# Patient Record
Sex: Male | Born: 1975 | Race: Black or African American | Hispanic: No | Marital: Single | Smoking: Current every day smoker
Health system: Southern US, Community
[De-identification: ages and names within clinical notes are randomized; demographics above are authoritative.]

## PROBLEM LIST (undated history)

## (undated) DIAGNOSIS — I219 Acute myocardial infarction, unspecified: Secondary | ICD-10-CM

## (undated) DIAGNOSIS — I2510 Atherosclerotic heart disease of native coronary artery without angina pectoris: Secondary | ICD-10-CM

## (undated) DIAGNOSIS — Z9289 Personal history of other medical treatment: Secondary | ICD-10-CM

## (undated) HISTORY — PX: OTHER SURGICAL HISTORY: SHX169

---

## 2003-06-21 DIAGNOSIS — I219 Acute myocardial infarction, unspecified: Secondary | ICD-10-CM

## 2003-06-21 HISTORY — DX: Acute myocardial infarction, unspecified: I21.9

## 2011-12-11 ENCOUNTER — Encounter (HOSPITAL_COMMUNITY): Payer: Self-pay | Admitting: Nurse Practitioner

## 2011-12-11 ENCOUNTER — Emergency Department (HOSPITAL_COMMUNITY)
Admission: EM | Admit: 2011-12-11 | Discharge: 2011-12-11 | Disposition: A | Discharge status: Home / Self Care | Payer: Self-pay | Attending: Emergency Medicine | Admitting: Emergency Medicine

## 2011-12-11 DIAGNOSIS — R51 Headache: Secondary | ICD-10-CM | POA: Insufficient documentation

## 2011-12-11 DIAGNOSIS — R079 Chest pain, unspecified: Secondary | ICD-10-CM | POA: Insufficient documentation

## 2011-12-11 HISTORY — DX: Acute myocardial infarction, unspecified: I21.9

## 2011-12-11 MED ORDER — ACETAMINOPHEN 325 MG PO TABS
650.0000 mg | ORAL_TABLET | Freq: Once | ORAL | Status: AC
  Administered 2011-12-11: 650 mg via ORAL
  Filled 2011-12-11: qty 2

## 2011-12-11 NOTE — ED Provider Notes (Signed)
History  This chart was scribed for Bernard Numbers, MD by Bennett Scrape. This patient was seen in room TR05C/TR05C and the patient's care was started at 2:46PM.  CSN: [id number]  Arrival date & time 12/11/11  1419   First MD Initiated Contact with Patient 12/11/11 1446      Chief Complaint  Patient presents with  . Headache    The history is provided by the patient. No language interpreter was used.   Bernard Pierce is a 36 y.o. male who presents to the Emergency Department complaining of a  gradual onset, gradually improving, constant HA since yesterday that he woke up with and one episode of intermittent sharp sternal chest pains and blurred vision. He denies chest pain and blurred vision currently. He has a h/o MI associated with cocaine use 8 years ago and denies that this pain is similar. He rates his HA a 2 out of 10 currently. He denies taking OTC medications at home to improve symptoms. He denies having any modifying factors. He also reports one month of fatigue that started after he stopped jogging but reports increased stress at work and reports working 30 hours straight occasionally. He also states drinking large amounts of coffee and Red Bulls but denies having any today. He also states that he had his systolic BP measured at 127 at church today and was concerned that it was high. He denies fevers, hematochezia, neck pain, numbness, nausea and emesis as associated symptoms. Pt is a rare alcohol user but denies smoking.  Past Medical History  Diagnosis Date  . Myocardial infarction-pt was using cocaine at the time 2004-2005    History reviewed. No pertinent past surgical history.  History reviewed. No pertinent family history.  History  Substance Use Topics  . Smoking status: Never Smoker   . Smokeless tobacco: Not on file  . Alcohol Use: Yes     rare      Review of Systems  Constitutional: Positive for fatigue. Negative for fever.  HENT: Negative for neck pain.     Eyes: Positive for visual disturbance (resolved). Negative for photophobia.  Cardiovascular: Positive for chest pain (resolved). Negative for palpitations.  Gastrointestinal: Negative for vomiting and diarrhea.  Neurological: Positive for headaches. Negative for numbness.  All other systems reviewed and are negative.    Allergies  Review of patient's allergies indicates no known allergies.  Home Medications   Current Outpatient Rx  Name Route Sig Dispense Refill  . ADULT MULTIVITAMIN W/MINERALS CH Oral Take 1 tablet by mouth daily.      Triage Vitals: BP 110/53  Pulse 55  Temp 97.9 F (36.6 C)  Resp 12  Ht 6\' 2"  (1.88 m)  Wt 220 lb (99.791 kg)  BMI 28.25 kg/m2  SpO2 97%  Physical Exam  Nursing note and vitals reviewed.  GEN: Well-developed, well-nourished male in  no distress HEENT: Atraumatic, normocephalic. Oropharynx clear without erythema EYES: PERRLA BL, no scleral icterus, no photophobia, EOM intact NECK: Trachea midline, no meningismus CV: regular rate  PULM: No respiratory distress.   Neuro: cranial nerves grossly 2-12 intact, no abnormalities of strength or sensation, A and O x 3, no pronator drift, normal finger to nose,  MSK: Patient moves all 4 extremities symmetrically, no deformity, edema, or injury noted Skin: No rashes petechiae, purpura, or jaundice Psych: no abnormality of mood  ED Course  Procedures (including critical care time)  DIAGNOSTIC STUDIES: Oxygen Saturation is 97% on room air, adequate by my interpretation.  COORDINATION OF CARE: 3:46PM-Discussed discharge plan of tylenol with pt and pt agreed to plan. Advised pt to make time for exercising and to take Excedrin as needed to help reduce the stress and HAs.     Labs Reviewed - No data to display No results found.   1. Headache       MDM  Patient was evaluated by myself. He had an absolutely normal neurologic exam and reported a headache was only down to 2/10 by the time  evaluated him. He denied any concerning exposures or symptoms including nausea, vomiting, and headache, or neck pain. Patient's blood pressure he was not having any chest pain on my valuation. Patient's prior chest pain had been associated with cocaine use which has not been active for some time. Patient was given 2 Tylenol for his headache. He has been working very hard at work and not sleeping as much as usual which is likely affecting this as well. He was discharged in good condition with instructions to continue over-the-counter medications. No intracranial imaging was warranted today based on my exam and patient presentation. Patient was comfortable with plan and was discharged in good condition.      I personally performed the services described in this documentation, which was scribed in my presence. The recorded information has been reviewed and considered.      Bernard Numbers, MD 12/11/11 [phone]

## 2011-12-11 NOTE — Discharge Instructions (Signed)

## 2011-12-11 NOTE — ED Notes (Signed)
Pt reports a headache since waking yesterday am, and fatigue over past few days. States he has been experiencing lots of stress at work. He is worried because his pastor checked his vital signs at church today and told him his "blood pressure was high and pulse was a little low."

## 2012-01-19 DIAGNOSIS — Z9289 Personal history of other medical treatment: Secondary | ICD-10-CM

## 2012-01-19 HISTORY — DX: Personal history of other medical treatment: Z92.89

## 2012-01-23 ENCOUNTER — Encounter (HOSPITAL_COMMUNITY): Payer: Self-pay | Admitting: *Deleted

## 2012-01-23 ENCOUNTER — Other Ambulatory Visit: Payer: Self-pay

## 2012-01-23 ENCOUNTER — Emergency Department (HOSPITAL_COMMUNITY): Payer: Medicaid Other

## 2012-01-23 ENCOUNTER — Emergency Department (HOSPITAL_COMMUNITY)
Admission: EM | Admit: 2012-01-23 | Discharge: 2012-01-23 | Disposition: A | Discharge status: Home / Self Care | Payer: Medicaid Other | Attending: Emergency Medicine | Admitting: Emergency Medicine

## 2012-01-23 DIAGNOSIS — I2510 Atherosclerotic heart disease of native coronary artery without angina pectoris: Secondary | ICD-10-CM | POA: Insufficient documentation

## 2012-01-23 DIAGNOSIS — R079 Chest pain, unspecified: Secondary | ICD-10-CM

## 2012-01-23 DIAGNOSIS — I252 Old myocardial infarction: Secondary | ICD-10-CM | POA: Insufficient documentation

## 2012-01-23 HISTORY — DX: Atherosclerotic heart disease of native coronary artery without angina pectoris: I25.10

## 2012-01-23 LAB — COMPREHENSIVE METABOLIC PANEL
ALT: 26 U/L (ref 0–53)
AST: 27 U/L (ref 0–37)
Calcium: 9.5 mg/dL (ref 8.4–10.5)
GFR calc Af Amer: >90 mL/min (ref >90)
Sodium: 139 mEq/L (ref 135–145)
Total Protein: 7.3 g/dL (ref 6.0–8.3)

## 2012-01-23 LAB — URINALYSIS, ROUTINE W REFLEX MICROSCOPIC
Bilirubin Urine: NEGATIVE
Glucose, UA: NEGATIVE mg/dL
Specific Gravity, Urine: 1.015 (ref 1.005–1.030)
Urobilinogen, UA: 0.2 mg/dL (ref 0.0–1.0)

## 2012-01-23 LAB — CBC WITH DIFFERENTIAL/PLATELET
Basophils Absolute: 0 10*3/uL (ref 0.0–0.1)
Basophils Relative: 0 % (ref 0–1)
Eosinophils Absolute: 0.1 10*3/uL (ref 0.0–0.7)
Eosinophils Relative: 1 % (ref 0–5)
MCH: 27.5 pg (ref 26.0–34.0)
MCHC: 34 g/dL (ref 30.0–36.0)
MCV: 80.8 fL (ref 78.0–100.0)
Platelets: 352 10*3/uL (ref 150–400)
RDW: 13.5 % (ref 11.5–15.5)
WBC: 5.1 10*3/uL (ref 4.0–10.5)

## 2012-01-23 LAB — URINE MICROSCOPIC-ADD ON

## 2012-01-23 LAB — RAPID URINE DRUG SCREEN, HOSP PERFORMED
Amphetamines: NOT DETECTED
Cocaine: NOT DETECTED
Opiates: NOT DETECTED
Tetrahydrocannabinol: NOT DETECTED

## 2012-01-23 LAB — TROPONIN I: Troponin I: <0.3 ng/mL (ref <0.30)

## 2012-01-23 MED ORDER — KETOROLAC TROMETHAMINE 30 MG/ML IJ SOLN
30.0000 mg | Freq: Once | INTRAMUSCULAR | Status: AC
  Administered 2012-01-23: 30 mg via INTRAVENOUS
  Filled 2012-01-23: qty 1

## 2012-01-23 MED ORDER — GI COCKTAIL ~~LOC~~
30.0000 mL | Freq: Once | ORAL | Status: AC
  Administered 2012-01-23: 30 mL via ORAL
  Filled 2012-01-23: qty 30
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
## 2012-01-23 NOTE — ED Notes (Signed)
Chest pain with sob, hx of chest pain in past assoc with cocaine, but denies any use now.  Says he began sweating.onset  4 days ago. And worse today.

## 2012-01-26 ENCOUNTER — Encounter: Payer: Self-pay | Admitting: Cardiovascular Disease

## 2012-01-30 ENCOUNTER — Ambulatory Visit (INDEPENDENT_AMBULATORY_CARE_PROVIDER_SITE_OTHER): Payer: Self-pay | Admitting: Cardiovascular Disease

## 2012-01-30 ENCOUNTER — Encounter: Payer: Self-pay | Admitting: Cardiology

## 2012-01-30 ENCOUNTER — Encounter: Payer: Self-pay | Admitting: Cardiovascular Disease

## 2012-01-30 VITALS — BP 102/62 | HR 56 | Ht 74.0 in | Wt 217.8 lb

## 2012-01-30 DIAGNOSIS — R5383 Other fatigue: Secondary | ICD-10-CM | POA: Insufficient documentation

## 2012-01-30 DIAGNOSIS — I219 Acute myocardial infarction, unspecified: Secondary | ICD-10-CM | POA: Insufficient documentation

## 2012-01-30 DIAGNOSIS — R079 Chest pain, unspecified: Secondary | ICD-10-CM

## 2012-01-30 DIAGNOSIS — I2510 Atherosclerotic heart disease of native coronary artery without angina pectoris: Secondary | ICD-10-CM | POA: Insufficient documentation

## 2012-01-30 NOTE — Assessment & Plan Note (Signed)
Functional.  Lab work ok in ER.  Encouraged him to find primary care doctor. Consider ESR and thyroid studies per primary if persists

## 2012-01-30 NOTE — Patient Instructions (Addendum)
**Note De-identified Bernard Pierce Obfuscation** Your physician recommends that you continue on your current medications as directed. Please refer to the Current Medication list given to you today.  Your physician has requested that you have an exercise tolerance test. For further information please visit [email or web address]. Please also follow instruction sheet, as given.  Your physician recommends that you schedule a follow-up appointment in: we will contact you with results of test  

## 2012-01-30 NOTE — Assessment & Plan Note (Signed)
Atypical with normal ECG and exam.  F/U ETT

## 2012-01-30 NOTE — Progress Notes (Signed)
Patient ID: Bernard Pierce, male   DOB: May 05, 1976, 36 y.o.   MRN: [id number] 36 yo referred by ER for chest pain.  Seen there 01/23/12  Patient presents with left-sided chest pain or shortness of breath as been going on for the past hour. He's been having intermittent left-sided chest pain over the past 3 days that comes and goes but never been as severe. Today while at rest his pain suddenly got worse with shortness of breath and diaphoresis. Similar to previous chest pain he said with cocaine use. States he has not had any cocaine since 2006. Denies having stents in the heart. Pain is left-sided does not radiate. It is somewhat better with sitting up. He reports several "heart attacks" in the early 2000s related to cocaine use. Denies ever having catheterization. States had negative stress test when admitted with cocaine chest pain. He was arguing on the phone when someone when the pain got worse.   R/O  ECG done 8/5  Normal except early repolarization rate 52   ROS: Denies fever, malais, weight loss, blurry vision, decreased visual acuity, cough, sputum, SOB, hemoptysis, pleuritic pain, palpitaitons, heartburn, abdominal pain, melena, lower extremity edema, claudication, or rash.  All other systems reviewed and negative   General: Affect appropriate Healthy:  appears stated age HEENT: normal Neck supple with no adenopathy JVP normal no bruits no thyromegaly Lungs clear with no wheezing and good diaphragmatic motion Heart:  S1/S2 no murmur,rub, gallop or click PMI normal Abdomen: benighn, BS positve, no tenderness, no AAA no bruit.  No HSM or HJR Distal pulses intact with no bruits No edema Neuro non-focal Skin warm and dry No muscular weakness  Medications Current Outpatient Prescriptions  Medication Sig Dispense Refill  . Multiple Vitamin (MULTIVITAMIN WITH MINERALS) TABS Take 1 tablet by mouth every morning.         Allergies Review of patient's allergies indicates no known  allergies.  Family History: No family history on file.  Social History: History   Social History  . Marital Status: Single    Spouse Name: N/A    Number of Children: N/A  . Years of Education: N/A   Occupational History  . Not on file.   Social History Main Topics  . Smoking status: Former Games developer  . Smokeless tobacco: Not on file  . Alcohol Use: Yes     rare  . Drug Use: No     used cocaine in past  . Sexually Active:    Other Topics Concern  . Not on file   Social History Narrative  . No narrative on file    Electrocardiogram:  NSR rate 52  Early repolarization   Assessment and Plan

## 2012-02-08 ENCOUNTER — Ambulatory Visit (INDEPENDENT_AMBULATORY_CARE_PROVIDER_SITE_OTHER): Payer: Self-pay | Admitting: Cardiology

## 2012-02-08 DIAGNOSIS — R079 Chest pain, unspecified: Secondary | ICD-10-CM

## 2012-02-08 NOTE — Progress Notes (Signed)
Stress Lab Nurses Notes - Bernard Pierce  Bernard Pierce 02/08/2012 Reason for doing test: Chest Pain Type of test: Regular GTX Nurse performing test: Parke Poisson, RN Nuclear Medicine Tech: Not Applicable Echo Tech: Not Applicable MD performing test: Ival Bible & Joni Reining NP Family MD: NPCP Test explained and consent signed: yes IV started: No IV started Symptoms: fatigue in legs Treatment/Intervention: None Reason test stopped: reached target HR After recovery IV was: NA Patient to return to Nuc. Med at : NA Patient discharged: Home Patient's Condition upon discharge was: stable Comments: During test peak BP 192/92 & HR 171.  Recovery BP 148/72 & HR 93.  Symptoms resolved in recovery. Erskine Speed T

## 2012-02-08 NOTE — Progress Notes (Signed)
Attending note:  Patient exercised on a Bruce protocol for 12 minutes achieving a maximum workload of 13.4 METS. Peak heart rate 171 beats per minute which was 93% of the maximum age predicted heart rate response. Peak blood pressure 218/82. No chest pain was reported. Baseline ECG shows sinus rhythm with probable early repolarization pattern. No clearly diagnostic ST segment abnormalities are noted, no arrhythmias noted. Overall negative GXT for ischemia, although hypertensive response noted.  Jonelle Sidle, M.D., F.A.C.C.

## 2012-10-25 ENCOUNTER — Encounter (HOSPITAL_COMMUNITY): Payer: Self-pay | Admitting: Emergency Medicine

## 2012-10-25 ENCOUNTER — Emergency Department (HOSPITAL_COMMUNITY)
Admission: EM | Admit: 2012-10-25 | Discharge: 2012-10-26 | Disposition: A | Discharge status: Home / Self Care | Payer: BC Managed Care – PPO | Attending: Emergency Medicine | Admitting: Emergency Medicine

## 2012-10-25 DIAGNOSIS — Z87891 Personal history of nicotine dependence: Secondary | ICD-10-CM | POA: Insufficient documentation

## 2012-10-25 DIAGNOSIS — R109 Unspecified abdominal pain: Secondary | ICD-10-CM | POA: Insufficient documentation

## 2012-10-25 DIAGNOSIS — I252 Old myocardial infarction: Secondary | ICD-10-CM | POA: Insufficient documentation

## 2012-10-25 DIAGNOSIS — I2510 Atherosclerotic heart disease of native coronary artery without angina pectoris: Secondary | ICD-10-CM | POA: Insufficient documentation

## 2012-10-25 LAB — CBC
HCT: 39.1 % (ref 39.0–52.0)
Platelets: 342 10*3/uL (ref 150–400)
RDW: 13.4 % (ref 11.5–15.5)
WBC: 5.5 10*3/uL (ref 4.0–10.5)

## 2012-10-25 MED ORDER — OXYCODONE-ACETAMINOPHEN 5-325 MG PO TABS
1.0000 | ORAL_TABLET | Freq: Once | ORAL | Status: AC
  Administered 2012-10-25: 1 via ORAL
  Filled 2012-10-25: qty 1

## 2012-10-25 NOTE — ED Notes (Signed)
Pt c/o abd pain on the rt side x 3 weeks.

## 2012-10-26 ENCOUNTER — Emergency Department (HOSPITAL_COMMUNITY): Payer: BC Managed Care – PPO

## 2012-10-26 LAB — BASIC METABOLIC PANEL
CO2: 28 mEq/L (ref 19–32)
Calcium: 9.3 mg/dL (ref 8.4–10.5)
Creatinine, Ser: 1.2 mg/dL (ref 0.50–1.35)
Glucose, Bld: 112 mg/dL — ABNORMAL HIGH (ref 70–99)
Sodium: 138 mEq/L (ref 135–145)

## 2012-10-26 MED ORDER — IOHEXOL 300 MG/ML  SOLN
100.0000 mL | Freq: Once | INTRAMUSCULAR | Status: AC | PRN
  Administered 2012-10-26: 100 mL via INTRAVENOUS

## 2012-10-26 MED ORDER — IOHEXOL 300 MG/ML  SOLN
50.0000 mL | Freq: Once | INTRAMUSCULAR | Status: AC | PRN
  Administered 2012-10-26: 50 mL via ORAL

## 2012-10-26 MED ORDER — OXYCODONE-ACETAMINOPHEN 5-325 MG PO TABS: 1.0000 | ORAL_TABLET | ORAL | Status: DC | PRN
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
## 2012-11-04 ENCOUNTER — Emergency Department (HOSPITAL_COMMUNITY): Payer: BC Managed Care – PPO

## 2012-11-04 ENCOUNTER — Encounter (HOSPITAL_COMMUNITY): Payer: Self-pay | Admitting: Emergency Medicine

## 2012-11-04 ENCOUNTER — Emergency Department (HOSPITAL_COMMUNITY)
Admission: EM | Admit: 2012-11-04 | Discharge: 2012-11-04 | Disposition: A | Discharge status: Home / Self Care | Payer: BC Managed Care – PPO | Attending: Emergency Medicine | Admitting: Emergency Medicine

## 2012-11-04 DIAGNOSIS — R188 Other ascites: Secondary | ICD-10-CM

## 2012-11-04 DIAGNOSIS — Z79899 Other long term (current) drug therapy: Secondary | ICD-10-CM | POA: Insufficient documentation

## 2012-11-04 DIAGNOSIS — Z87891 Personal history of nicotine dependence: Secondary | ICD-10-CM | POA: Insufficient documentation

## 2012-11-04 DIAGNOSIS — I252 Old myocardial infarction: Secondary | ICD-10-CM | POA: Insufficient documentation

## 2012-11-04 DIAGNOSIS — R091 Pleurisy: Secondary | ICD-10-CM | POA: Insufficient documentation

## 2012-11-04 DIAGNOSIS — R109 Unspecified abdominal pain: Secondary | ICD-10-CM

## 2012-11-04 DIAGNOSIS — I2510 Atherosclerotic heart disease of native coronary artery without angina pectoris: Secondary | ICD-10-CM | POA: Insufficient documentation

## 2012-11-04 DIAGNOSIS — Z7982 Long term (current) use of aspirin: Secondary | ICD-10-CM | POA: Insufficient documentation

## 2012-11-04 DIAGNOSIS — R0789 Other chest pain: Secondary | ICD-10-CM

## 2012-11-04 LAB — URINALYSIS, ROUTINE W REFLEX MICROSCOPIC
Bilirubin Urine: NEGATIVE
Glucose, UA: NEGATIVE mg/dL
Hgb urine dipstick: NEGATIVE
Leukocytes, UA: NEGATIVE
Protein, ur: NEGATIVE mg/dL

## 2012-11-04 LAB — COMPREHENSIVE METABOLIC PANEL
AST: 22 U/L (ref 0–37)
Alkaline Phosphatase: 57 U/L (ref 39–117)
BUN: 16 mg/dL (ref 6–23)
CO2: 30 mEq/L (ref 19–32)
Chloride: 101 mEq/L (ref 96–112)
Creatinine, Ser: 1.11 mg/dL (ref 0.50–1.35)
GFR calc non Af Amer: 83 mL/min — ABNORMAL LOW (ref >90)
Total Bilirubin: 0.6 mg/dL (ref 0.3–1.2)

## 2012-11-04 LAB — CBC WITH DIFFERENTIAL/PLATELET
Basophils Absolute: 0 10*3/uL (ref 0.0–0.1)
HCT: 37.3 % — ABNORMAL LOW (ref 39.0–52.0)
Hemoglobin: 12.7 g/dL — ABNORMAL LOW (ref 13.0–17.0)
Lymphocytes Relative: 32 % (ref 12–46)
Monocytes Absolute: 0.5 10*3/uL (ref 0.1–1.0)
Monocytes Relative: 7 % (ref 3–12)
Neutro Abs: 3.9 10*3/uL (ref 1.7–7.7)
WBC: 6.5 10*3/uL (ref 4.0–10.5)

## 2012-11-04 MED ORDER — OXYCODONE-ACETAMINOPHEN 5-325 MG PO TABS: 1.0000 | ORAL_TABLET | ORAL | Status: DC | PRN

## 2012-11-04 NOTE — ED Notes (Signed)
Dr. Effie Shy at bedside to assess.
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
## 2012-11-04 NOTE — ED Notes (Signed)
Patient recently seen here approximately 10 days ago. Reports chest pain with activity and on deep inspiration. Also complains of shortness of breath, right side pain, and back pain that radiates from upper back area through both shoulders.

## 2012-11-05 ENCOUNTER — Ambulatory Visit (HOSPITAL_COMMUNITY)
Admit: 2012-11-05 | Discharge: 2012-11-05 | Disposition: A | Discharge status: Home / Self Care | Payer: BC Managed Care – PPO | Source: Ambulatory Visit | Attending: Emergency Medicine | Admitting: Emergency Medicine

## 2012-11-05 DIAGNOSIS — R188 Other ascites: Secondary | ICD-10-CM | POA: Insufficient documentation

## 2012-11-13 ENCOUNTER — Encounter (HOSPITAL_COMMUNITY): Payer: Self-pay | Admitting: Emergency Medicine

## 2012-11-13 DIAGNOSIS — I252 Old myocardial infarction: Secondary | ICD-10-CM | POA: Insufficient documentation

## 2012-11-13 DIAGNOSIS — Z7982 Long term (current) use of aspirin: Secondary | ICD-10-CM | POA: Insufficient documentation

## 2012-11-13 DIAGNOSIS — I2510 Atherosclerotic heart disease of native coronary artery without angina pectoris: Secondary | ICD-10-CM | POA: Insufficient documentation

## 2012-11-13 DIAGNOSIS — Z87891 Personal history of nicotine dependence: Secondary | ICD-10-CM | POA: Insufficient documentation

## 2012-11-13 DIAGNOSIS — R197 Diarrhea, unspecified: Secondary | ICD-10-CM | POA: Insufficient documentation

## 2012-11-13 DIAGNOSIS — R109 Unspecified abdominal pain: Secondary | ICD-10-CM | POA: Insufficient documentation

## 2012-11-13 DIAGNOSIS — Z79899 Other long term (current) drug therapy: Secondary | ICD-10-CM | POA: Insufficient documentation

## 2012-11-13 LAB — CBC WITH DIFFERENTIAL/PLATELET
Eosinophils Absolute: 0.1 10*3/uL (ref 0.0–0.7)
Eosinophils Relative: 2 % (ref 0–5)
HCT: 38.7 % — ABNORMAL LOW (ref 39.0–52.0)
Hemoglobin: 13.6 g/dL (ref 13.0–17.0)
Lymphocytes Relative: 38 % (ref 12–46)
Lymphs Abs: 2.4 10*3/uL (ref 0.7–4.0)
MCH: 27.9 pg (ref 26.0–34.0)
MCV: 79.3 fL (ref 78.0–100.0)
Monocytes Absolute: 0.4 10*3/uL (ref 0.1–1.0)
Monocytes Relative: 6 % (ref 3–12)
RBC: 4.88 MIL/uL (ref 4.22–5.81)

## 2012-11-13 LAB — URINALYSIS, ROUTINE W REFLEX MICROSCOPIC
Bilirubin Urine: NEGATIVE
Nitrite: NEGATIVE
Protein, ur: NEGATIVE mg/dL
Specific Gravity, Urine: 1.034 — ABNORMAL HIGH (ref 1.005–1.030)
Urobilinogen, UA: 0.2 mg/dL (ref 0.0–1.0)

## 2012-11-13 LAB — URINE MICROSCOPIC-ADD ON

## 2012-11-13 NOTE — ED Notes (Signed)
Flank pain x1 month. Worsening today. Some discomfort with urination. Nausea without emesis. Diarrhea today x5. Patient states pain is intermittent. No immediate pain on palpation. Patient requesting colonoscopy.

## 2012-11-14 ENCOUNTER — Emergency Department (HOSPITAL_COMMUNITY)
Admission: EM | Admit: 2012-11-14 | Discharge: 2012-11-14 | Disposition: A | Discharge status: Home / Self Care | Payer: BC Managed Care – PPO | Attending: Emergency Medicine | Admitting: Emergency Medicine

## 2012-11-14 DIAGNOSIS — R197 Diarrhea, unspecified: Secondary | ICD-10-CM

## 2012-11-14 DIAGNOSIS — R109 Unspecified abdominal pain: Secondary | ICD-10-CM

## 2012-11-14 HISTORY — DX: Personal history of other medical treatment: Z92.89

## 2012-11-14 LAB — COMPREHENSIVE METABOLIC PANEL
BUN: 17 mg/dL (ref 6–23)
CO2: 31 mEq/L (ref 19–32)
Calcium: 9.1 mg/dL (ref 8.4–10.5)
GFR calc Af Amer: >90 mL/min (ref >90)
GFR calc non Af Amer: 88 mL/min — ABNORMAL LOW (ref >90)
Glucose, Bld: 102 mg/dL — ABNORMAL HIGH (ref 70–99)
Total Protein: 7.5 g/dL (ref 6.0–8.3)

## 2012-11-14 LAB — LIPASE, BLOOD: Lipase: 60 U/L — ABNORMAL HIGH (ref 11–59)

## 2012-11-14 LAB — OCCULT BLOOD, POC DEVICE: Fecal Occult Bld: NEGATIVE

## 2012-11-14 MED ORDER — OXYCODONE-ACETAMINOPHEN 5-325 MG PO TABS: 1.0000 | ORAL_TABLET | ORAL | Status: DC | PRN

## 2012-11-14 NOTE — ED Notes (Signed)
EDP went back into room to explain discharge to pt. Pt discharged with rx medications. Pt would not sign for discharge.
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
## 2012-11-14 NOTE — ED Notes (Signed)
Pt states that he went to [facility] twice in the last 30 days for the same pain in his right side. Pt states that he had ultrasound and CT that were inconclusive. Pt states that he has been having pain in his right side after he eats and when pressed. Pt states that today the pain got worse and he has been having loose bowel movements as well.

## 2012-11-14 NOTE — ED Notes (Addendum)
Pt concerned about being discharge due to this being his third visit and wants to talk to the EDP again. EDP aware

## 2013-10-01 ENCOUNTER — Emergency Department (HOSPITAL_COMMUNITY)
Admission: EM | Admit: 2013-10-01 | Discharge: 2013-10-01 | Disposition: A | Discharge status: Home / Self Care | Payer: BC Managed Care – PPO | Attending: Emergency Medicine | Admitting: Emergency Medicine

## 2013-10-01 ENCOUNTER — Encounter (HOSPITAL_COMMUNITY): Payer: Self-pay | Admitting: Emergency Medicine

## 2013-10-01 ENCOUNTER — Emergency Department (HOSPITAL_COMMUNITY): Payer: BC Managed Care – PPO

## 2013-10-01 DIAGNOSIS — I2510 Atherosclerotic heart disease of native coronary artery without angina pectoris: Secondary | ICD-10-CM | POA: Insufficient documentation

## 2013-10-01 DIAGNOSIS — R0789 Other chest pain: Secondary | ICD-10-CM

## 2013-10-01 DIAGNOSIS — R071 Chest pain on breathing: Secondary | ICD-10-CM | POA: Insufficient documentation

## 2013-10-01 DIAGNOSIS — R0602 Shortness of breath: Secondary | ICD-10-CM | POA: Insufficient documentation

## 2013-10-01 DIAGNOSIS — F17200 Nicotine dependence, unspecified, uncomplicated: Secondary | ICD-10-CM | POA: Insufficient documentation

## 2013-10-01 DIAGNOSIS — I252 Old myocardial infarction: Secondary | ICD-10-CM | POA: Insufficient documentation

## 2013-10-01 LAB — COMPREHENSIVE METABOLIC PANEL
ALBUMIN: 3.7 g/dL (ref 3.5–5.2)
ALT: 29 U/L (ref 0–53)
AST: 24 U/L (ref 0–37)
Alkaline Phosphatase: 52 U/L (ref 39–117)
BUN: 15 mg/dL (ref 6–23)
CALCIUM: 8.8 mg/dL (ref 8.4–10.5)
CO2: 27 meq/L (ref 19–32)
Chloride: 105 mEq/L (ref 96–112)
Creatinine, Ser: 0.98 mg/dL (ref 0.50–1.35)
GFR calc Af Amer: >90 mL/min (ref >90)
GFR calc non Af Amer: >90 mL/min (ref >90)
Glucose, Bld: 116 mg/dL — ABNORMAL HIGH (ref 70–99)
Potassium: 4.2 mEq/L (ref 3.7–5.3)
SODIUM: 143 meq/L (ref 137–147)
TOTAL PROTEIN: 7.2 g/dL (ref 6.0–8.3)
Total Bilirubin: 0.9 mg/dL (ref 0.3–1.2)

## 2013-10-01 LAB — CBC
HCT: 37.7 % — ABNORMAL LOW (ref 39.0–52.0)
Hemoglobin: 13.2 g/dL (ref 13.0–17.0)
MCH: 27.7 pg (ref 26.0–34.0)
MCHC: 35 g/dL (ref 30.0–36.0)
MCV: 79 fL (ref 78.0–100.0)
PLATELETS: 353 10*3/uL (ref 150–400)
RBC: 4.77 MIL/uL (ref 4.22–5.81)
RDW: 13.1 % (ref 11.5–15.5)
WBC: 4.9 10*3/uL (ref 4.0–10.5)

## 2013-10-01 LAB — PRO B NATRIURETIC PEPTIDE: Pro B Natriuretic peptide (BNP): 32.3 pg/mL (ref 0–125)

## 2013-10-01 LAB — TROPONIN I: Troponin I: <0.3 ng/mL (ref <0.30)

## 2013-10-01 MED ORDER — DIPHENHYDRAMINE HCL 25 MG PO CAPS
50.0000 mg | ORAL_CAPSULE | Freq: Once | ORAL | Status: AC
  Administered 2013-10-01: 50 mg via ORAL
  Filled 2013-10-01: qty 2
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
## 2013-10-01 NOTE — Discharge Instructions (Signed)
You have been diagnosed by your caregiver as having chest wall pain. SEEK IMMEDIATE MEDICAL ATTENTION IF: You develop a fever.  Your chest pains become severe or intolerable.  You develop new, unexplained symptoms (problems).  You develop shortness of breath, nausea, vomiting, sweating or feel light headed.  You develop a new cough or you cough up blood.  Your caregiver has diagnosed you as having chest pain that is not specific for one problem, but does not require admission.  You are at low risk for an acute heart condition or other serious illness. Chest pain comes from many different causes.  SEEK IMMEDIATE MEDICAL ATTENTION IF: You have severe chest pain, especially if the pain is crushing or pressure-like and spreads to the arms, back, neck, or jaw, or if you have sweating, nausea (feeling sick to your stomach), or shortness of breath. THIS IS AN EMERGENCY. Don't wait to see if the pain will go away. Get medical help at once. Call 911 or 0 (operator). DO NOT drive yourself to the hospital.   You feel dizzy or faint.  You have chest pain not typical of your usual pain for which you originally saw your caregiver.  Emergency Department Resource Guide 1) Find a Doctor and Pay Out of Pocket Although you won't have to find out who is covered by your insurance plan, it is a good idea to ask around and get recommendations. You will then need to call the office and see if the doctor you have chosen will accept you as a new patient and what types of options they offer for patients who are self-pay. Some doctors offer discounts or will set up payment plans for their patients who do not have insurance, but you will need to ask so you aren't surprised when you get to your appointment.  2) Contact Your Local Health Department Not all health departments have doctors that can see patients for sick visits, but many do, so it is worth a call to see if yours does. If you don't know where your local health  department is, you can check in your phone book. The CDC also has a tool to help you locate your state's health department, and many state websites also have listings of all of their local health departments.  3) Find a Walk-in Clinic If your illness is not likely to be very severe or complicated, you may want to try a walk in clinic. These are popping up all over the country in pharmacies, drugstores, and shopping centers. They're usually staffed by nurse practitioners or physician assistants that have been trained to treat common illnesses and complaints. They're usually fairly quick and inexpensive. However, if you have serious medical issues or chronic medical problems, these are probably not your best option.  No Primary Care Doctor: - Call Health Connect at  [phone] - they can help you locate a primary care doctor that  accepts your insurance, provides certain services, etc. - Physician Referral Service- [phone]  Chronic Pain Problems: Organization         Address  Phone   Notes  [facility]  [phone] Patients need to be referred by their primary care doctor.   Medication Assistance: Organization         Address  Phone   Notes  [facility] Medication [facility] [address] [place], [place] [address] [phone] --Must be a resident of [facility] -- Must have NO insurance coverage whatsoever (no  Medicaid/ Medicare, etc.) -- The pt. MUST have a primary care doctor that directs their care regularly and follows them in the community   MedAssist  [phone]   [employer]  [phone]) [phone]    Agencies that provide inexpensive medical care: Organization         Address  Phone   Notes  [facility]  [phone]   [facility]    [phone]) [phone]   [facility] [address] [place], [place] [id number] [phone]) [phone]   [facility] [address], [place] [phone]   [facility]anned Parenthood    [phone]) [phone]   [facility]    [phone]) [phone]   [facility] and [facility]  [address], [place] Phone:  [phone], Fax:  [phone] Hours of Operation:  9 am - 6 pm, M-F.  Also accepts Medicaid/Medicare and self-pay.  [facility]  [address], [place] Phone: [phone], Fax: [phone]. Hours of Operation:  8:30 am - 5:30 pm, M-F.  Also accepts Medicaid and self-pay.  [facility] [place] [address], [place] Phone: [phone]) [phone]   [facility] [address], [place], [place] [phone], Ext. [phone] Mondays & Thursdays: 7-9 AM.  First 15 patients are seen on a first come, first serve basis.    Medicaid-accepting [facility] Providers:  Organization         Address  Phone   Notes  [facility] [address], [place] [phone]) [phone] Also accepts self-pay patients.  [facility] [address] [place], [place]  [phone]) [phone]   [facility] [address], [place] [phone]) [phone]   [facility] [address] [place] [address], [place] [phone]) [phone]   Renaye RakersVeita Bland [address], [place]   [phone] Only accepts [place] Access [place] patients after they have their name applied to their card.   Self-Pay (no insurance) in [facility]:  Organization         Address  Phone   Notes  Sickle Cell Patients, [facility] [address], [place] [phone]) [phone]   [facility] [address], [place] [phone]) [phone]   [facility] [place]  [address], [place] [phone]   [facility]/Dr. Osei-Bonsu  [address] [place] [address], [place] or [phone], [place] [phone] Phone number for both [place] and  [place] locations is the same.  [facility]gent [facility] and [facility] [address], [place] [phone]   [facility] [address] [place] [address], [place] or [address] [phone]) [phone]   [facility] [address], [place] [phone]) [phone], phone; [phone]) [phone], fax Sees patients 1st and 3rd Saturday of every month.  Must not qualify for public or private insurance (i.e. Medicaid, Medicare, [place] Health Choice, Veterans' Benefits)  Household income should be no more than 200% of the poverty level The clinic cannot treat you if you are pregnant or think you are pregnant  Sexually transmitted diseases are not treated at the clinic.   Dental Care: Organization         Address  Phone  Notes  [facility] [address], [place] [phone] Accepts children up to age 221 who are enrolled in [place]icaid or [place] Health Choice; pregnant women with a Medicaid card; and children who have applied for Medicaid or [place] Health Choice, but were declined, whose parents can pay a reduced fee at time of service.  [facility] [place]  [address], [place] [phone] Accepts children up to age 39 who are enrolled in [place] or [place] Health Choice; pregnant women with a Medicaid card; and children who have applied for Medicaid or [place] Health Choice, but were declined, whose parents can pay a reduced fee at time of service.  [facility]  [address], [place] [phone]) [phone] Patients are seen by appointment only. Walk-ins are not accepted. [facility] will see patients 39 years of age and older. Monday - Tuesday (8am-5pm) Most Wednesdays (8:30-5pm) $30 per visit, cash only  [facility]  [address], [facility] [phone]) [phone] Patients are seen by appointment only. Walk-ins are not accepted. [facility] will see patients 39 years of age and older. One Wednesday Evening (Monthly: Volunteer Based).  $30 per visit, cash only  [employer] [facility]  [phone]) [phone] for adults; Children under age 714, call Graduate Pediatric Dentistry at [phone]. Children aged 674-14, please call [phone] to request a pediatric application.  Dental services are provided in all areas of dental care including fillings, crowns and bridges, complete and partial dentures, implants, gum treatment, root canals, and extractions. Preventive care is also provided. Treatment is provided to both adults and children. Patients are selected via a lottery and there is often a waiting list.   [facility] [address], [place]  [phone]) [phone] [email or web address]   [facility] [address], [place], [place] [phone], Ext. [phone] Second and Fourth Thursday of each month, opens at 6:30 AM; Clinic ends at 9 AM.  Patients are seen on a first-come first-served basis, and a limited number are seen during each clinic.   [facility]  [address], [place], [place] [phone]) [phone]   Eligibility Requirements You must have lived in [place], [place], or [place] counties for at least the last three months.   You cannot be eligible for state or federal sponsored National Cityhealthcare insurance, including CIGNAVeterans Administration, [place]icaid, or [employer]Medicare.   You generally cannot be eligible for healthcare insurance through your employer.    How to apply: Eligibility screenings are held every Tuesday and Wednesday afternoon from 1:00 pm until 4:00 pm. You do not need an appointment for the interview!  [facility] [address], [place], [place] [phone]   [facility]  [phone]   [facility]  [phone]   [facility]  [phone]    Behavioral Health Resources in the Community: Intensive  Outpatient Programs Organization         Address  Phone  Notes  [facility] [address], [place], [place] [phone]   [facility] [address], [place], [place] [phone]   [facility] [address], [place], [place]  [phone]   [facility] [address],  [place], [place] [phone] or [phone]   Substance Abuse Resources Organization         Address  Phone  Notes  [facility] Drug Services  [phone]   [facility]  [phone]   [facility] [place]  [phone]   Floydene Flock  [phone]   Residential & Outpatient Substance Abuse Program  [phone]   Psychological Services Organization         Address  Phone  Notes  [facility]  [phone]   [facility]  [phone]   [facility] [address], [place] [phone] or [phone]    Mobile Crisis Teams Organization         Address  Phone  Notes  [facility], Mobile Crisis Care Unit  [phone]   [facility]  [address] [place], [place] [phone]   Doristine Locks [address] [place] [phone]    Self-Help/Support Groups Organization         Address  Phone             Notes  [facility] [place] - variety of support groups  [phone]- [phone] Call for more information  Narcotics Anonymous (NA), [facility] [address], [employer] [place]  2 meetings at this location   Statistician         Address  Phone  Notes  [facility] [address],    [place]  [phone]   [facility]  [address] [place] [address], [place], [place] [phone]   [facility] [address] [place], [place] [phone] Admissions: 8am-3pm M-F  [facility] [address]    [place], [place] [phone]   [facility] [address] [place], [place], [place] [phone]   [facility] [address]  [place], [place] [phone]   [facility] - Intensive Outpatient [address], [place], [place] [phone]   [facility] ([facility] [address]  [place], [place] [phone] or [phone]   [facility] ([facility]) [address], [place], [place] [phone] Accepts Medicaid  [facility] [place] [address],  [place] [phone] Substance Abuse/Addiction Treatment   [facility] Organization         Address  Phone  Notes  [facility]  [phone]   Angie Fava, PhD [address] [place], [place]   [phone] or [phone]   [facility]   [address] [place], [place] [phone]   [facility] [address], [place], [place] [phone] Insurance/Medicaid/sponsorship through [facility] and Families [address]                                    [place], [place] [phone] Therapy/tele-psych/case  [facility] [address], [place] [phone]    Dr. Lolly Mustache  [phone]   [facility] [place]  [employer] [facility] Dept. 1) [address] [place] 2) [address] [place] 3)  [address], [place] [phone]  [phone]   [facility] Child Abuse Hotline 607-558-3558 or [phone] (After Hours)

## 2014-04-12 ENCOUNTER — Encounter (HOSPITAL_COMMUNITY): Payer: Self-pay | Admitting: Emergency Medicine

## 2014-04-12 ENCOUNTER — Emergency Department (HOSPITAL_COMMUNITY)
Admission: EM | Admit: 2014-04-12 | Discharge: 2014-04-12 | Disposition: A | Discharge status: Home / Self Care | Payer: BC Managed Care – PPO | Attending: Emergency Medicine | Admitting: Emergency Medicine

## 2014-04-12 DIAGNOSIS — J029 Acute pharyngitis, unspecified: Secondary | ICD-10-CM | POA: Insufficient documentation

## 2014-04-12 DIAGNOSIS — H9209 Otalgia, unspecified ear: Secondary | ICD-10-CM | POA: Insufficient documentation

## 2014-04-12 DIAGNOSIS — I2510 Atherosclerotic heart disease of native coronary artery without angina pectoris: Secondary | ICD-10-CM | POA: Insufficient documentation

## 2014-04-12 DIAGNOSIS — R51 Headache: Secondary | ICD-10-CM | POA: Insufficient documentation

## 2014-04-12 DIAGNOSIS — I252 Old myocardial infarction: Secondary | ICD-10-CM | POA: Insufficient documentation

## 2014-04-12 DIAGNOSIS — J069 Acute upper respiratory infection, unspecified: Secondary | ICD-10-CM

## 2014-04-12 DIAGNOSIS — Z7982 Long term (current) use of aspirin: Secondary | ICD-10-CM | POA: Insufficient documentation

## 2014-04-12 MED ORDER — AMOXICILLIN 250 MG PO CAPS
ORAL_CAPSULE | ORAL | Status: DC
Start: 2014-04-12 — End: 2014-04-12
  Filled 2014-04-12: qty 2

## 2014-04-12 MED ORDER — IBUPROFEN 800 MG PO TABS
800.0000 mg | ORAL_TABLET | Freq: Once | ORAL | Status: AC
  Administered 2014-04-12: 800 mg via ORAL

## 2014-04-12 MED ORDER — IBUPROFEN 800 MG PO TABS: 800.0000 mg | ORAL_TABLET | Freq: Three times a day (TID) | ORAL | Status: DC

## 2014-04-12 MED ORDER — HYDROCODONE-ACETAMINOPHEN 5-325 MG PO TABS
ORAL_TABLET | ORAL | Status: AC
  Filled 2014-04-12: qty 1

## 2014-04-12 MED ORDER — PREDNISONE 50 MG PO TABS
ORAL_TABLET | ORAL | Status: AC
  Filled 2014-04-12: qty 1

## 2014-04-12 MED ORDER — PREDNISONE 10 MG PO TABS
ORAL_TABLET | ORAL | Status: AC
  Filled 2014-04-12: qty 1

## 2014-04-12 MED ORDER — IBUPROFEN 800 MG PO TABS
ORAL_TABLET | ORAL | Status: AC
  Filled 2014-04-12: qty 1

## 2014-04-12 MED ORDER — PREDNISONE 50 MG PO TABS
60.0000 mg | ORAL_TABLET | Freq: Once | ORAL | Status: AC
Start: 2014-04-12 — End: 2014-04-12
  Administered 2014-04-12: 60 mg via ORAL

## 2014-04-12 MED ORDER — PREDNISONE 10 MG PO TABS: ORAL_TABLET | ORAL | Status: DC

## 2014-04-12 MED ORDER — HYDROCODONE-ACETAMINOPHEN 5-325 MG PO TABS
1.0000 | ORAL_TABLET | Freq: Once | ORAL | Status: AC
  Administered 2014-04-12: 1 via ORAL

## 2014-04-12 MED ORDER — HYDROCODONE-ACETAMINOPHEN 5-325 MG PO TABS: 1.0000 | ORAL_TABLET | ORAL | Status: DC | PRN

## 2014-04-12 MED ORDER — ONDANSETRON HCL 4 MG PO TABS
ORAL_TABLET | ORAL | Status: AC
  Filled 2014-04-12: qty 1

## 2014-04-12 MED ORDER — AMOXICILLIN 250 MG PO CAPS
500.0000 mg | ORAL_CAPSULE | Freq: Once | ORAL | Status: AC
  Administered 2014-04-12: 500 mg via ORAL

## 2014-04-12 MED ORDER — ONDANSETRON HCL 4 MG PO TABS
4.0000 mg | ORAL_TABLET | Freq: Once | ORAL | Status: AC
  Administered 2014-04-12: 4 mg via ORAL

## 2014-04-12 MED ORDER — AMOXICILLIN 500 MG PO CAPS: 500.0000 mg | ORAL_CAPSULE | Freq: Three times a day (TID) | ORAL | Status: DC

## 2014-04-12 NOTE — ED Provider Notes (Signed)
Medical screening examination/treatment/procedure(s) were performed by non-physician practitioner and as supervising physician I was immediately available for consultation/collaboration.   EKG Interpretation None       Donnetta HutchingBrian Marvella Jenning, MD 04/12/14 1919

## 2014-04-12 NOTE — Discharge Instructions (Signed)

## 2014-04-12 NOTE — ED Notes (Signed)
Patient with no complaints at this time. Respirations even and unlabored. Skin warm/dry. Discharge instructions reviewed with patient at this time. Patient given opportunity to voice concerns/ask questions. Patient discharged at this time and left Emergency Department with steady gait.   

## 2014-04-12 NOTE — ED Notes (Addendum)
Patient c/o left ear pain that started 3 days ago with yellow drainage. Per patient pain radiates ito left side of neck. Patient also c/o sore throat with painful swallowing. Denies any difficulty breathing. Patient able to handle secretions while awake but report excessive drooling while sleeping. Patient also reports fevers. Patient reports taking alkalizer cold and flu and baby aspirin.
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
## 2014-11-02 IMAGING — CT CT ABD-PELV W/ CM
2 of 3 series · 15 of 46 positions shown, 17 images · IV contrast (omnipaque)
Comparison: None.

CLINICAL DATA: 36-year-old male with right-side abdominal pain.
Right lower quadrant pain.

CT ABDOMEN AND PELVIS WITH CONTRAST
TECHNIQUE: Multidetector CT imaging of the abdomen and pelvis was
performed following the standard protocol during bolus
administration of intravenous contrast.
Contrast: 50mL OMNIPAQUE IOHEXOL 300 MG/ML  SOLN

[Series 2: abd_pel_with 5.0 b40f · axial · 0.75mm/px · z∈[-502,-32]mm · 12 of 108 slices shown, 14 images]
[im 7/108  soft-tissue]
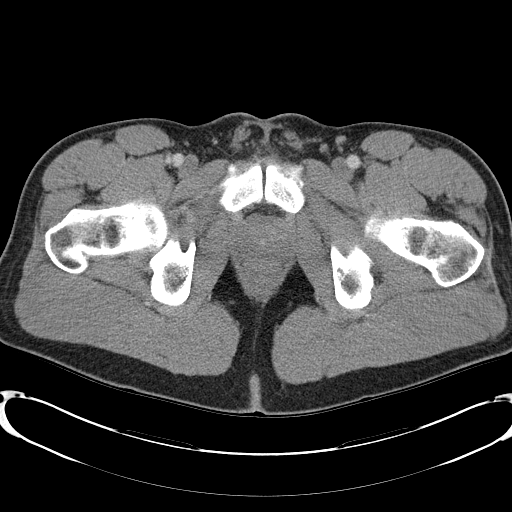
[im 7/108  bone]
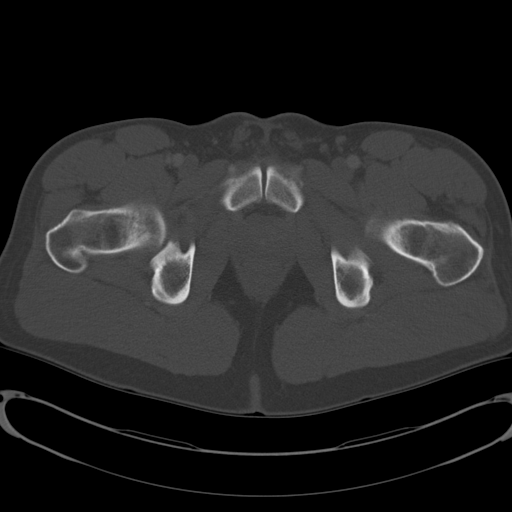
[im 14/108  soft-tissue]
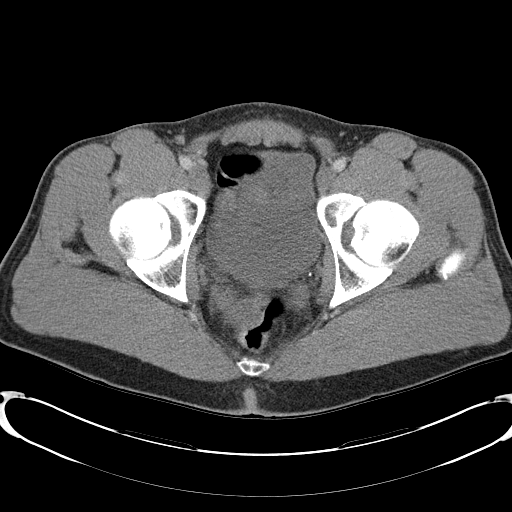
[im 25/108  soft-tissue]
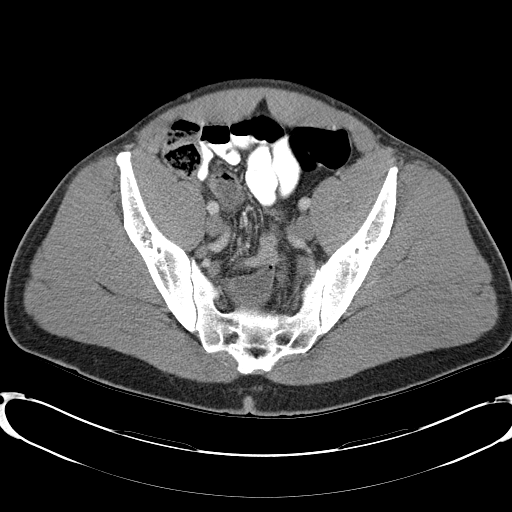
[im 32/108  soft-tissue]
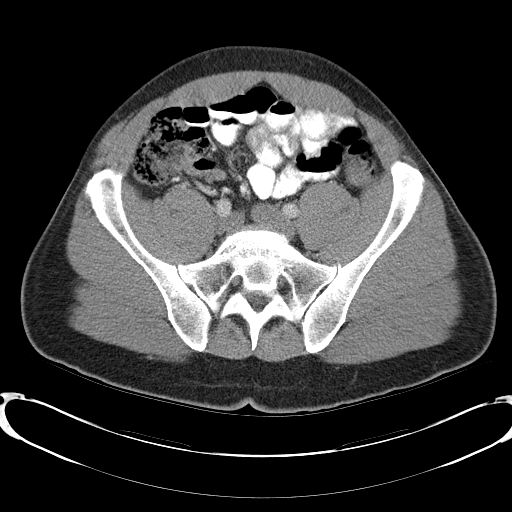
[im 42/108  soft-tissue]
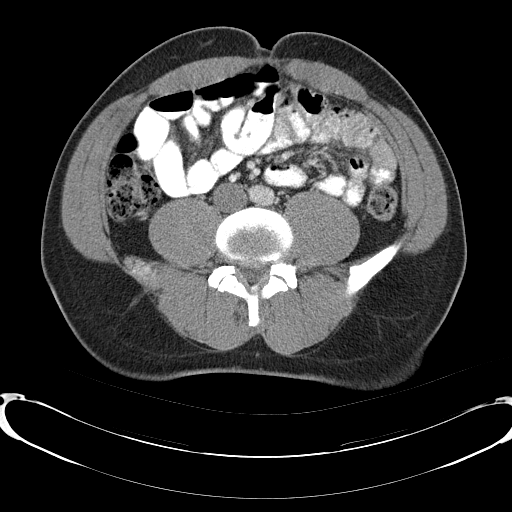
[im 49/108  soft-tissue]
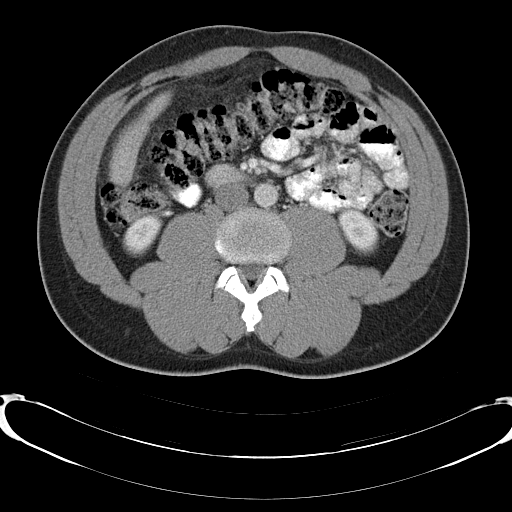
[im 59/108  soft-tissue]
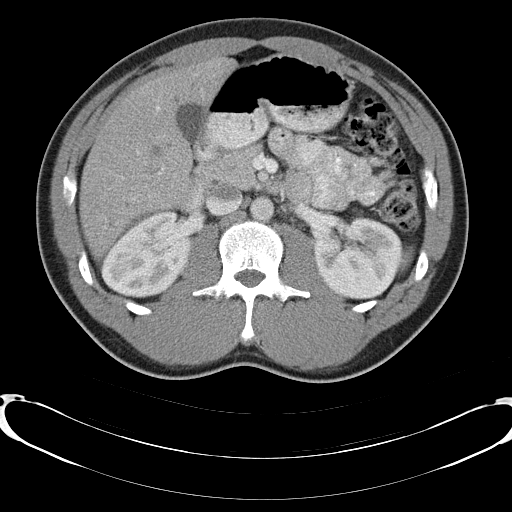
[im 66/108  soft-tissue]
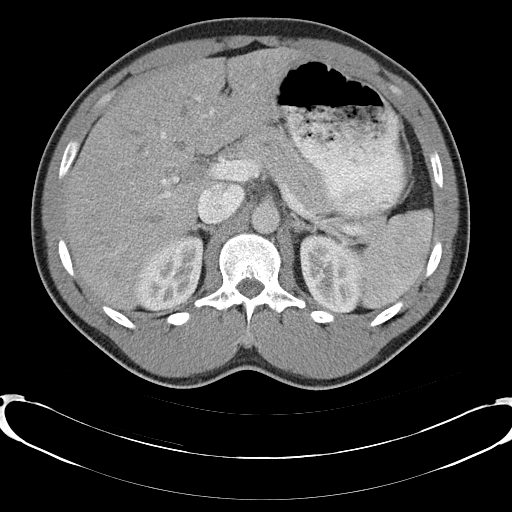
[im 76/108  soft-tissue]
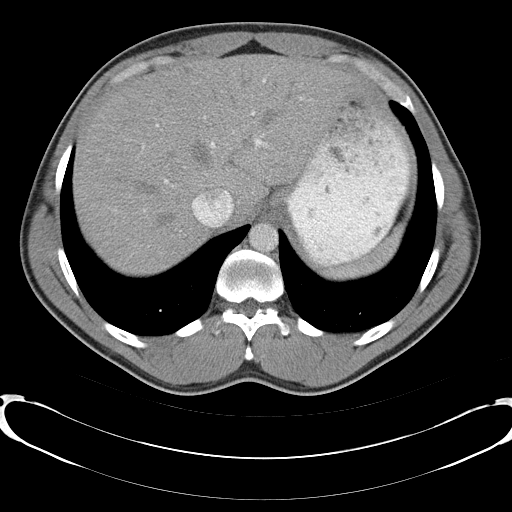
[im 76/108  bone]
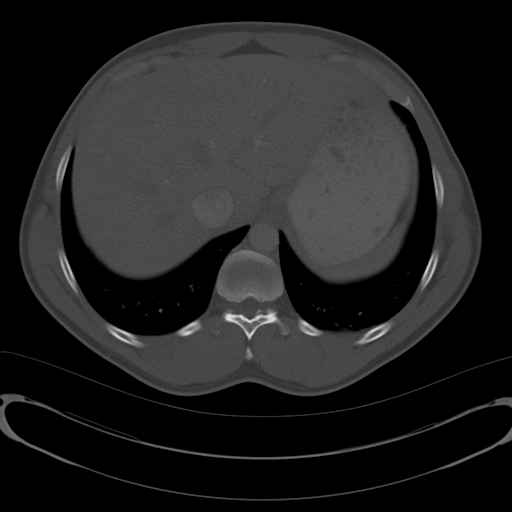
[im 83/108  soft-tissue]
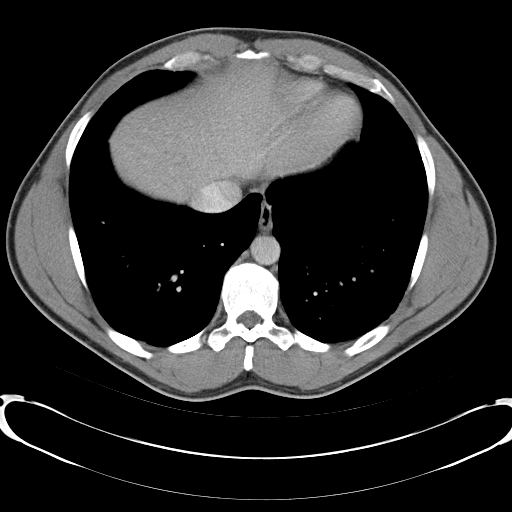
[im 94/108  soft-tissue]
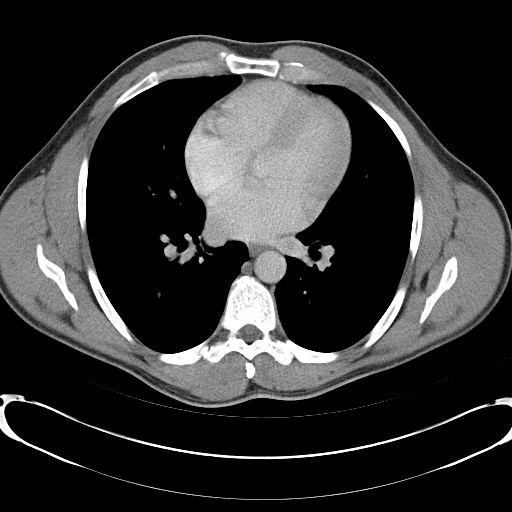
[im 101/108  soft-tissue]
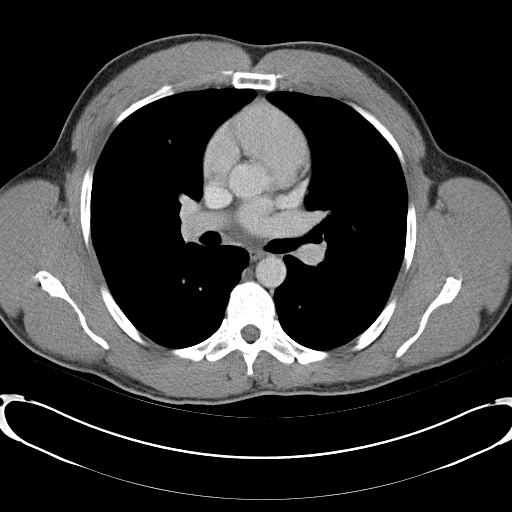

[Series 4: abd_pel_with 3.0 spo cor · coronal · 0.71mm/px · 3 of 90 slices shown]
[im 30/90  soft-tissue]
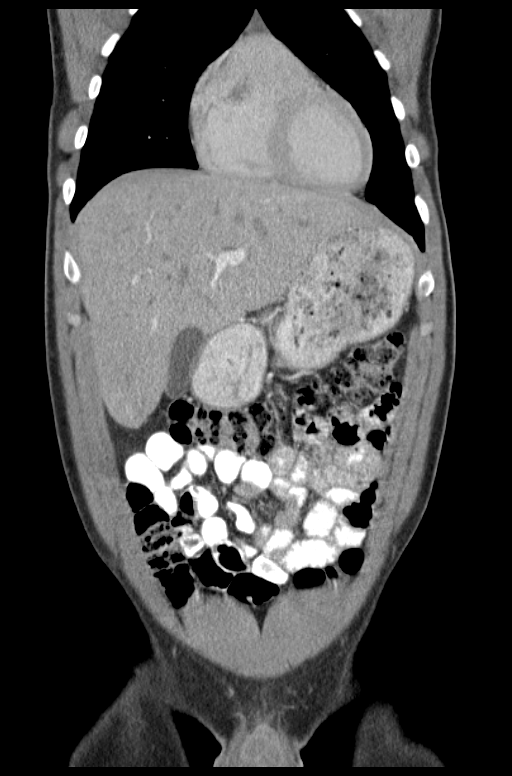
[im 40/90  soft-tissue]
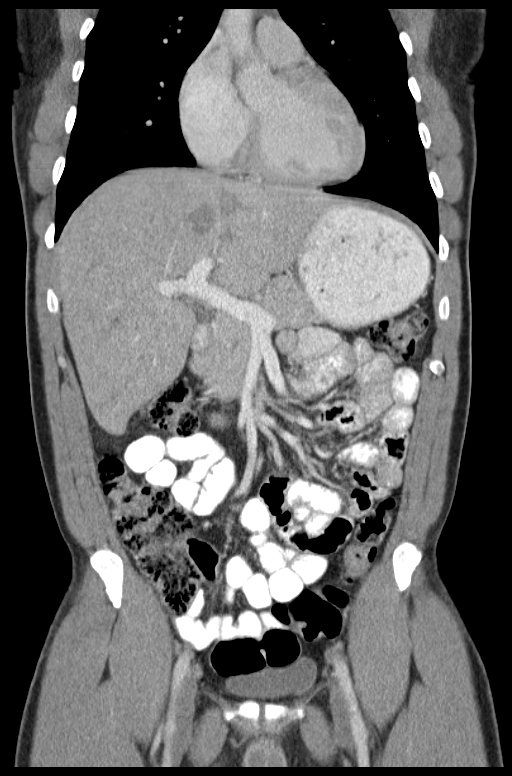
[im 50/90  soft-tissue]
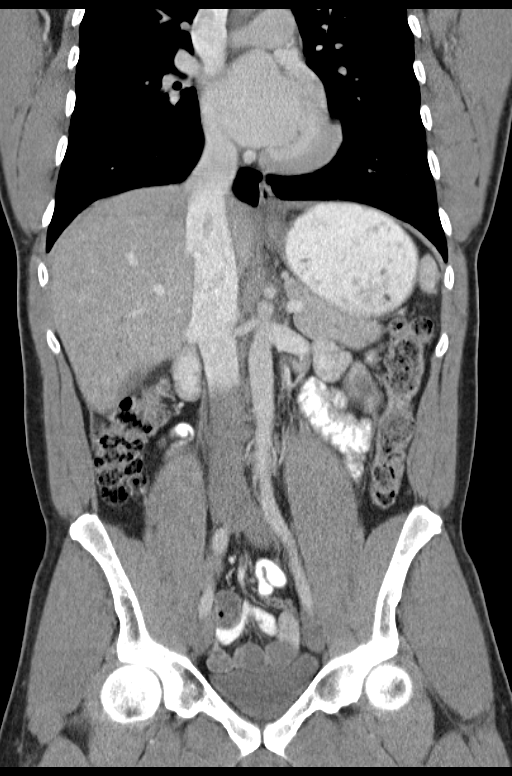

[15 of 46 positions shown; findings below may reference images not displayed]

FINDINGS: Negative right lung base.  Minimal left costophrenic
angle dependent atelectasis.  There is a 3 mm pulmonary nodule in
the right lung along the minor fissure, likely postinflammatory.
No pericardial or pleural effusion.

No acute osseous abnormality identified.

No pelvic free fluid.  Negative distal colon.  Negative left colon.
Transverse colon within normal limits.  No focal inflammation of
the right colon.  There is trace fluid in Morison's pouch along the
inferior right liver contour (series 2 image 53).  Oral contrast
has not yet reached the distal small bowel or cecum, but the
appendix is normal (series 2 image 81).  Cecum and terminal ileum
are within normal limits.  No dilated small bowel.  Stomach mildly
distended with contrast.  Duodenum within normal limits.

The liver is within normal limits.  The portal venous system is
patent.  The gallbladder has a normal CT appearance.  Spleen,
pancreas and adrenal glands are normal.  The left kidney and
proximal left ureter are normal.  The right kidney is normally
enhancing and nonobstructed.  Proximal right ureter is
decompressed.  There may also be trace fluid in the gastrohepatic
ligament.  Major arterial structures in the abdomen and pelvis are
patent and within normal limits.  No lymphadenopathy identified.
Bladder is unremarkable.
IMPRESSION: 1.  Trace free fluid about the liver and in Morison's pouch.  This
is nonspecific and no other focal inflammatory changes are
identified to suggest the etiology.  Query hepatitis.
2.  Normal appendix.  No inflamed bowel is identified.

## 2015-04-19 ENCOUNTER — Emergency Department (HOSPITAL_COMMUNITY)
Admission: EM | Admit: 2015-04-19 | Discharge: 2015-04-19 | Disposition: A | Discharge status: Home / Self Care | Payer: 59 | Attending: Emergency Medicine | Admitting: Emergency Medicine

## 2015-04-19 ENCOUNTER — Emergency Department (HOSPITAL_COMMUNITY): Payer: 59

## 2015-04-19 ENCOUNTER — Encounter (HOSPITAL_COMMUNITY): Payer: Self-pay | Admitting: Emergency Medicine

## 2015-04-19 DIAGNOSIS — I2510 Atherosclerotic heart disease of native coronary artery without angina pectoris: Secondary | ICD-10-CM | POA: Diagnosis not present

## 2015-04-19 DIAGNOSIS — F17210 Nicotine dependence, cigarettes, uncomplicated: Secondary | ICD-10-CM | POA: Diagnosis not present

## 2015-04-19 DIAGNOSIS — I252 Old myocardial infarction: Secondary | ICD-10-CM | POA: Insufficient documentation

## 2015-04-19 DIAGNOSIS — M7711 Lateral epicondylitis, right elbow: Secondary | ICD-10-CM | POA: Insufficient documentation

## 2015-04-19 DIAGNOSIS — M25521 Pain in right elbow: Secondary | ICD-10-CM | POA: Diagnosis present

## 2015-04-19 NOTE — Discharge Instructions (Signed)
Tennis Elbow Tennis elbow (lateral epicondylitis) is inflammation of the outer tendons of your forearm close to your elbow. Your tendons attach your muscles to your bones. The outer tendons of your forearm are used to extend your wrist, and they attach on the outside part of your elbow. Tennis elbow is often found in people who play tennis, but anyone may get the condition from repeatedly extending the wrist or turning the forearm. CAUSES This condition is caused by repeatedly extending your wrist and using your hands. It can result from sports or work that requires repetitive forearm movements. Tennis elbow may also be caused by an injury. RISK FACTORS You have a higher risk of developing tennis elbow if you play tennis or another racquet sport. You also have a higher risk if you frequently use your hands for work. This condition is also more likely to develop in:  Musicians.  Carpenters, painters, and plumbers.  Cooks.  Cashiers.  People who work in factories.  Holiday reWal-Martpresentative[employer] workers.  Butchers.  People who use computers. SYMPTOMS Symptoms of this condition include:  Pain and tenderness in your forearm and the outer part of your elbow. You may only feel the pain when you use your arm, or you may feel it even when you are not using your arm.  A burning feeling that runs from your elbow through your arm.  Weak grip in your hands. DIAGNOSIS  This condition may be diagnosed by medical history and physical exam. You may also have other tests, including:  X-rays.  MRI. TREATMENT Your health care provider will recommend lifestyle adjustments, such as resting and icing your arm. Treatment may also include:  Medicines for inflammation. This may include shots of cortisone if your pain continues.  Physical therapy. This may include massage or exercises.  An elbow brace. Surgery may eventually be recommended if your pain does not go away with treatment. HOME CARE  INSTRUCTIONS Activity  Rest your elbow and wrist as directed by your health care provider. Try to avoid any activities that caused the problem until your health care provider says that you can do them again.  If a physical therapist teaches you exercises, do all of them as directed.  If you lift an object, lift it with your palm facing upward. This lowers the stress on your elbow. Lifestyle  If your tennis elbow is caused by sports, check your equipment and make sure that:  You are using it correctly.  It is the best fit for you.  If your tennis elbow is caused by work, take breaks frequently, if you are able. Talk with your manager about how to best perform tasks in a way that is safe.  If your tennis elbow is caused by computer use, talk with your manager about any changes that can be made to your work environment. General Instructions  ITake medicines only as directed by your health care provider.  If you were given a brace, wear it as directed by your health care provider.  Keep all follow-up visits as directed by your health care provider. This is important. SEEK MEDICAL CARE IF:  Your pain does not get better with treatment.  Your pain gets worse.  You have numbness or weakness in your forearm, hand, or fingers.   This information is not intended to replace advice given to you by your health care provider. Make sure you discuss any questions you have with your health care provider.   Document Released: 06/06/2005 Document Revised: 10/21/2014 Document  Reviewed: 06/02/2014 Elsevier Interactive Patient Education [employer].
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
## 2015-04-19 NOTE — ED Notes (Signed)
Pt stated he was lifting something heavy last week at work and felt a pull down his right arm. Pt states that the pain got better through the week. Pt went to work yesterday and states the pain returned. Pt states the pain is in his right elbow area and has some tingling in his fingers. No obvious deformity noted and pt denies swelling.

## 2015-06-08 ENCOUNTER — Telehealth: Payer: Self-pay | Admitting: Orthopedic Surgery

## 2015-06-08 NOTE — Telephone Encounter (Signed)
Call received from patient/wife, inquiring about appointment [facility] for right elbow pain - date of Emergency room visit 04/19/15.  States had gotten better after that; therefore, did not schedule orthopedic appointment per referral to our office.  States now pain has returned; asking if may be scheduled before end of year, when insurance terms.  Relayed we'd be glad to try to schedule, being that he was seen at Emergency department, however, [employer] [facility] [employer] insurance requires referral from primary care provider.  States will request that, and then call back to follow up if needs to schedule.
